# Patient Record
Sex: Female | Born: 1985 | Race: White | Hispanic: No | Marital: Single | Smoking: Current every day smoker
Health system: Southern US, Community
[De-identification: ages and names within clinical notes are randomized; demographics above are authoritative.]

## PROBLEM LIST (undated history)

## (undated) HISTORY — PX: TYMPANOPLASTY WITH GRAFT: SHX6567

---
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
## 2007-10-10 ENCOUNTER — Encounter: Admission: RE | Admit: 2007-10-10 | Discharge: 2007-10-10 | Payer: Self-pay | Admitting: Orthopedic Surgery

## 2007-11-08 ENCOUNTER — Emergency Department (HOSPITAL_COMMUNITY): Admission: EM | Admit: 2007-11-08 | Discharge: 2007-11-08 | Payer: Self-pay | Admitting: Oncology
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
## 2008-04-03 ENCOUNTER — Encounter: Admission: RE | Admit: 2008-04-03 | Discharge: 2008-04-03 | Payer: Self-pay | Admitting: Family Medicine

## 2012-07-02 ENCOUNTER — Encounter (HOSPITAL_COMMUNITY): Payer: Self-pay | Admitting: Emergency Medicine

## 2012-07-02 ENCOUNTER — Emergency Department (HOSPITAL_COMMUNITY)
Admission: EM | Admit: 2012-07-02 | Discharge: 2012-07-03 | Disposition: A | Discharge status: Home / Self Care | Payer: 59 | Attending: Emergency Medicine | Admitting: Emergency Medicine

## 2012-07-02 DIAGNOSIS — L0291 Cutaneous abscess, unspecified: Secondary | ICD-10-CM

## 2012-07-02 DIAGNOSIS — F17200 Nicotine dependence, unspecified, uncomplicated: Secondary | ICD-10-CM | POA: Insufficient documentation

## 2012-07-02 DIAGNOSIS — Z3202 Encounter for pregnancy test, result negative: Secondary | ICD-10-CM | POA: Insufficient documentation

## 2012-07-02 DIAGNOSIS — Y929 Unspecified place or not applicable: Secondary | ICD-10-CM | POA: Insufficient documentation

## 2012-07-02 DIAGNOSIS — L03319 Cellulitis of trunk, unspecified: Secondary | ICD-10-CM | POA: Insufficient documentation

## 2012-07-02 DIAGNOSIS — L02219 Cutaneous abscess of trunk, unspecified: Secondary | ICD-10-CM | POA: Insufficient documentation

## 2012-07-02 DIAGNOSIS — Y939 Activity, unspecified: Secondary | ICD-10-CM | POA: Insufficient documentation

## 2012-07-02 DIAGNOSIS — L089 Local infection of the skin and subcutaneous tissue, unspecified: Secondary | ICD-10-CM | POA: Insufficient documentation

## 2012-07-02 NOTE — ED Notes (Signed)
PT. REPORTS ? INSECT BITE AT LEFT LATERAL BACK ONSET YESTERDAY WITH REDDNESS/ NO DRAINAGE.

## 2012-07-03 LAB — PREGNANCY, URINE: Preg Test, Ur: NEGATIVE

## 2012-07-03 MED ORDER — SULFAMETHOXAZOLE-TMP DS 800-160 MG PO TABS
1.0000 | ORAL_TABLET | Freq: Once | ORAL | Status: AC
  Administered 2012-07-03: 1 via ORAL
  Filled 2012-07-03: qty 1

## 2012-07-03 MED ORDER — SULFAMETHOXAZOLE-TRIMETHOPRIM 800-160 MG PO TABS: 1.0000 | ORAL_TABLET | Freq: Two times a day (BID) | ORAL | Status: DC

## 2012-07-03 NOTE — ED Provider Notes (Signed)
History   This chart was scribed for Meghan Hutching, MD by Sofie Rower, ED Scribe. The patient was seen in room TR07C/TR07C and the patient's care was started at 12:02PM.     CSN: [id number]  Arrival date & time 07/02/12  2342   First MD Initiated Contact with Patient 07/03/12 0002      Chief Complaint  Patient presents with  . Insect Bite    (Consider location/radiation/quality/duration/timing/severity/associated sxs/prior treatment) The history is provided by the patient. No language interpreter was used.   JESIAH YERBY is a 26 y.o. female , who presents to the Emergency Department complaining of   sudden, progressively worsening, insect bite, located at the left lateral inferior chest wall, onset yesterday .  Associated symptoms include erythema located at the left lateral inferior chest wall. The pt reports she is concerned that she may have been bit by a bug yesterday, in addition to another possible pregnancy concern.   The pt is a current everyday smoker, however, she does not drink alcohol.    History reviewed. No pertinent past medical history.  History reviewed. No pertinent past surgical history.  No family history on file.  History  Substance Use Topics  . Smoking status: Current Every Day Smoker  . Smokeless tobacco: Not on file  . Alcohol Use: No    OB History    Grav Para Term Preterm Abortions TAB SAB Ect Mult Living                  Review of Systems  Skin: Positive for rash.  All other systems reviewed and are negative.    Allergies  Amoxil  Home Medications  No current outpatient prescriptions on file.  BP 153/94  Pulse 88  Temp 98.7 F (37.1 C) (Oral)  Resp 14  SpO2 99%  LMP 05/27/2012  Physical Exam  Nursing note and vitals reviewed. Constitutional: She is oriented to person, place, and time. She appears well-developed and well-nourished.  HENT:  Head: Atraumatic.  Nose: Nose normal.  Pulmonary/Chest:       Left lateral inferior  chest wall: Area of erythema 4 X 2 CM with central indurated core of 1 CM.   Musculoskeletal: Normal range of motion.  Neurological: She is alert and oriented to person, place, and time.  Skin: Skin is warm and dry.  Psychiatric: She has a normal mood and affect.    ED Course  Procedures (including critical care time)  DIAGNOSTIC STUDIES: Oxygen Saturation is 99% on room air, normal by my interpretation.    COORDINATION OF CARE:  12:08 AM- Treatment plan discussed with patient. Pt agrees with treatment.      Labs Reviewed - No data to display No results found.   No diagnosis found.    MDM  Antibiotic, moist heat;  Not ready for I and D      I personally performed the services described in this documentation, which was scribed in my presence. The recorded information has been reviewed and is accurate.    Meghan Hutching, MD 07/03/12 [phone]

## 2012-07-04 NOTE — L&D Delivery Note (Signed)
Delivery Note Was notified pt completely dilated by RN and they were going to start pushing.  Approximately 5-10 minutes later was called to delivery and came immediately.  Arrived to find baby delivered and on mother's chest.  Faculty practice in room but apparently did not have time to glove and RN delivered baby.  RN states they had not even begun pushing, and pt called out saying baby coming. At 10:50 AM a healthy female was delivered OA with involuntary pushing. Placenta status:  I delivered on arrival spontaneouos and intact:.  Cord:  with the following complications:none .  Anesthesia:  epidural Episiotomy: none Lacerations: small vaginal abrasions, one sutured at 600 for hemostasis Suture Repair: 3.0 vicryl rapide Est. Blood Loss (mL): 350cc  Mom to postpartum.  Baby to stay with mother  Meghan Garrett 04/17/2013, 11:09 AM

## 2012-12-04 ENCOUNTER — Emergency Department (INDEPENDENT_AMBULATORY_CARE_PROVIDER_SITE_OTHER)
Admission: EM | Admit: 2012-12-04 | Discharge: 2012-12-04 | Disposition: A | Discharge status: Home / Self Care | Payer: Worker's Compensation | Source: Home / Self Care

## 2012-12-04 ENCOUNTER — Encounter (HOSPITAL_COMMUNITY): Payer: Self-pay | Admitting: Emergency Medicine

## 2012-12-04 DIAGNOSIS — W010XXA Fall on same level from slipping, tripping and stumbling without subsequent striking against object, initial encounter: Secondary | ICD-10-CM

## 2012-12-04 DIAGNOSIS — R109 Unspecified abdominal pain: Secondary | ICD-10-CM

## 2012-12-04 LAB — POCT URINALYSIS DIP (DEVICE)
Bilirubin Urine: NEGATIVE
Ketones, ur: NEGATIVE mg/dL
Leukocytes, UA: NEGATIVE
Specific Gravity, Urine: >=1.03 (ref 1.005–1.030)
pH: 5.5 (ref 5.0–8.0)

## 2012-12-04 NOTE — ED Notes (Signed)
Pt c/o cramping. Pt is [redacted] wks pregnant. Pt states that she tripped over wires at work and is unsure of how she landed. Pt has only had one liter of water to drink today. Appetite is good. Denies any other symptoms.  Fetal heart rate was 154. Mw,cma

## 2012-12-04 NOTE — ED Provider Notes (Signed)
History     CSN: [id number]  Arrival date & time 12/04/12  1719   None     Chief Complaint  Patient presents with  . Fall    tripped over wires at work. "cant remember how i landed"    (Consider location/radiation/quality/duration/timing/severity/associated sxs/prior treatment) Patient is a 27 y.o. female presenting with fall. The history is provided by the patient and a parent.  Fall This is a new problem. The current episode started 3 to 5 hours ago. The problem has been gradually improving. Associated symptoms include abdominal pain. Associated symptoms comments: 21 wk preg, tripped and fell on abd, having cramping without bleeding.Marland Kitchen    History reviewed. No pertinent past medical history.  History reviewed. No pertinent past surgical history.  History reviewed. No pertinent family history.  History  Substance Use Topics  . Smoking status: Current Every Day Smoker  . Smokeless tobacco: Not on file  . Alcohol Use: No    OB History   Grav Para Term Preterm Abortions TAB SAB Ect Mult Living   1               Review of Systems  Constitutional: Negative.   Gastrointestinal: Positive for abdominal pain. Negative for nausea, vomiting and diarrhea.  Genitourinary: Negative for vaginal bleeding and pelvic pain.    Allergies  Amoxil  Home Medications   Current Outpatient Rx  Name  Route  Sig  Dispense  Refill  . sulfamethoxazole-trimethoprim (SEPTRA DS) 800-160 MG per tablet   Oral   Take 1 tablet by mouth 2 (two) times daily.   20 tablet   0     BP 124/74  Pulse 89  Temp(Src) 98.6 F (37 C) (Oral)  Resp 18  SpO2 100%  LMP 07/06/2012  Physical Exam  Nursing note and vitals reviewed. Constitutional: She is oriented to person, place, and time. She appears well-developed and well-nourished.  HENT:  Head: Normocephalic and atraumatic.  Neck: Normal range of motion. Neck supple.  Abdominal: Soft. Bowel sounds are normal. She exhibits mass. There is no  rebound and no guarding.  Uterus palp ,nontender, no visible trauma.fht active.  Neurological: She is alert and oriented to person, place, and time.  Skin: Skin is warm and dry.    ED Course  Procedures (including critical care time)  Labs Reviewed  POCT URINALYSIS DIP (DEVICE)   No results found.   1. Fall due to stumbling, initial encounter       MDM  fht heardin llq 140. U/a neg.        Linna Hoff, MD 12/04/12 [phone]

## 2012-12-04 NOTE — ED Notes (Signed)
Pt states that she has a regular routine prenatal visit tomorrow. Mw,cma

## 2013-01-17 LAB — OB RESULTS CONSOLE ABO/RH

## 2013-01-17 LAB — OB RESULTS CONSOLE HEPATITIS B SURFACE ANTIGEN: Hepatitis B Surface Ag: NEGATIVE

## 2013-01-17 LAB — OB RESULTS CONSOLE RUBELLA ANTIBODY, IGM: Rubella: IMMUNE

## 2013-01-17 LAB — OB RESULTS CONSOLE RPR: RPR: NONREACTIVE

## 2013-01-17 LAB — OB RESULTS CONSOLE ANTIBODY SCREEN: Antibody Screen: NEGATIVE

## 2013-03-13 LAB — OB RESULTS CONSOLE GBS: GBS: POSITIVE

## 2013-04-15 ENCOUNTER — Telehealth (HOSPITAL_COMMUNITY): Payer: Self-pay | Admitting: *Deleted

## 2013-04-15 NOTE — Telephone Encounter (Signed)
Preadmission screen  

## 2013-04-17 ENCOUNTER — Inpatient Hospital Stay (HOSPITAL_COMMUNITY): Payer: 59 | Admitting: Anesthesiology

## 2013-04-17 ENCOUNTER — Inpatient Hospital Stay (HOSPITAL_COMMUNITY)
Admission: AD | Admit: 2013-04-17 | Discharge: 2013-04-19 | DRG: 775 | Disposition: A | Discharge status: Home / Self Care | Payer: 59 | Source: Ambulatory Visit | Attending: Obstetrics and Gynecology | Admitting: Obstetrics and Gynecology

## 2013-04-17 ENCOUNTER — Encounter (HOSPITAL_COMMUNITY): Payer: Self-pay | Admitting: *Deleted

## 2013-04-17 ENCOUNTER — Encounter (HOSPITAL_COMMUNITY): Payer: 59 | Admitting: Anesthesiology

## 2013-04-17 DIAGNOSIS — Z880 Allergy status to penicillin: Secondary | ICD-10-CM

## 2013-04-17 DIAGNOSIS — O99892 Other specified diseases and conditions complicating childbirth: Secondary | ICD-10-CM | POA: Diagnosis present

## 2013-04-17 DIAGNOSIS — Z3400 Encounter for supervision of normal first pregnancy, unspecified trimester: Secondary | ICD-10-CM

## 2013-04-17 DIAGNOSIS — Z3403 Encounter for supervision of normal first pregnancy, third trimester: Secondary | ICD-10-CM

## 2013-04-17 DIAGNOSIS — O99334 Smoking (tobacco) complicating childbirth: Secondary | ICD-10-CM | POA: Diagnosis present

## 2013-04-17 DIAGNOSIS — Z22330 Carrier of Group B streptococcus: Secondary | ICD-10-CM

## 2013-04-17 LAB — CBC
HCT: 37.7 % (ref 36.0–46.0)
Hemoglobin: 12.9 g/dL (ref 12.0–15.0)
MCHC: 34.2 g/dL (ref 30.0–36.0)
WBC: 15.8 10*3/uL — ABNORMAL HIGH (ref 4.0–10.5)

## 2013-04-17 MED ORDER — OXYTOCIN 40 UNITS IN LACTATED RINGERS INFUSION - SIMPLE MED: 1.0000 m[IU]/min | INTRAVENOUS | Status: DC

## 2013-04-17 MED ORDER — FENTANYL CITRATE 0.05 MG/ML IJ SOLN
INTRAMUSCULAR | Status: AC
  Filled 2013-04-17: qty 2

## 2013-04-17 MED ORDER — ACETAMINOPHEN 325 MG PO TABS: 650.0000 mg | ORAL_TABLET | ORAL | Status: DC | PRN

## 2013-04-17 MED ORDER — FENTANYL 2.5 MCG/ML BUPIVACAINE 1/10 % EPIDURAL INFUSION (WH - ANES)
14.0000 mL/h | INTRAMUSCULAR | Status: DC | PRN
  Filled 2013-04-17 (×2): qty 125

## 2013-04-17 MED ORDER — BUTORPHANOL TARTRATE 1 MG/ML IJ SOLN
1.0000 mg | INTRAMUSCULAR | Status: DC | PRN
  Administered 2013-04-17: 1 mg via INTRAVENOUS
  Filled 2013-04-17: qty 1

## 2013-04-17 MED ORDER — BENZOCAINE-MENTHOL 20-0.5 % EX AERO
1.0000 "application " | INHALATION_SPRAY | CUTANEOUS | Status: DC | PRN
  Administered 2013-04-17: 1 via TOPICAL
  Filled 2013-04-17: qty 56

## 2013-04-17 MED ORDER — ONDANSETRON HCL 4 MG/2ML IJ SOLN: 4.0000 mg | Freq: Four times a day (QID) | INTRAMUSCULAR | Status: DC | PRN

## 2013-04-17 MED ORDER — FENTANYL 2.5 MCG/ML BUPIVACAINE 1/10 % EPIDURAL INFUSION (WH - ANES)
INTRAMUSCULAR | Status: DC | PRN
  Administered 2013-04-17: 14 mL/h via EPIDURAL

## 2013-04-17 MED ORDER — LACTATED RINGERS IV SOLN: INTRAVENOUS | Status: DC

## 2013-04-17 MED ORDER — DIBUCAINE 1 % RE OINT: 1.0000 "application " | TOPICAL_OINTMENT | RECTAL | Status: DC | PRN

## 2013-04-17 MED ORDER — ONDANSETRON HCL 4 MG PO TABS: 4.0000 mg | ORAL_TABLET | ORAL | Status: DC | PRN

## 2013-04-17 MED ORDER — BUPIVACAINE HCL (PF) 0.25 % IJ SOLN
INTRAMUSCULAR | Status: DC | PRN
  Administered 2013-04-17 (×2): 5 mL via EPIDURAL

## 2013-04-17 MED ORDER — PHENYLEPHRINE 40 MCG/ML (10ML) SYRINGE FOR IV PUSH (FOR BLOOD PRESSURE SUPPORT)
80.0000 ug | PREFILLED_SYRINGE | INTRAVENOUS | Status: DC | PRN
  Filled 2013-04-17: qty 2

## 2013-04-17 MED ORDER — OXYCODONE-ACETAMINOPHEN 5-325 MG PO TABS: 1.0000 | ORAL_TABLET | ORAL | Status: DC | PRN

## 2013-04-17 MED ORDER — TETANUS-DIPHTH-ACELL PERTUSSIS 5-2.5-18.5 LF-MCG/0.5 IM SUSP: 0.5000 mL | Freq: Once | INTRAMUSCULAR | Status: DC

## 2013-04-17 MED ORDER — DIPHENHYDRAMINE HCL 25 MG PO CAPS: 25.0000 mg | ORAL_CAPSULE | Freq: Four times a day (QID) | ORAL | Status: DC | PRN

## 2013-04-17 MED ORDER — LANOLIN HYDROUS EX OINT: TOPICAL_OINTMENT | CUTANEOUS | Status: DC | PRN

## 2013-04-17 MED ORDER — VANCOMYCIN HCL IN DEXTROSE 1-5 GM/200ML-% IV SOLN
1000.0000 mg | Freq: Two times a day (BID) | INTRAVENOUS | Status: DC
  Filled 2013-04-17: qty 200

## 2013-04-17 MED ORDER — LACTATED RINGERS IV SOLN
500.0000 mL | INTRAVENOUS | Status: DC | PRN
  Administered 2013-04-17: 500 mL via INTRAVENOUS

## 2013-04-17 MED ORDER — SENNOSIDES-DOCUSATE SODIUM 8.6-50 MG PO TABS
2.0000 | ORAL_TABLET | ORAL | Status: DC
  Administered 2013-04-18: 2 via ORAL
  Filled 2013-04-17: qty 2

## 2013-04-17 MED ORDER — OXYTOCIN BOLUS FROM INFUSION: 500.0000 mL | INTRAVENOUS | Status: DC

## 2013-04-17 MED ORDER — WITCH HAZEL-GLYCERIN EX PADS: 1.0000 "application " | MEDICATED_PAD | CUTANEOUS | Status: DC | PRN

## 2013-04-17 MED ORDER — ONDANSETRON HCL 4 MG/2ML IJ SOLN: 4.0000 mg | INTRAMUSCULAR | Status: DC | PRN

## 2013-04-17 MED ORDER — PHENYLEPHRINE 40 MCG/ML (10ML) SYRINGE FOR IV PUSH (FOR BLOOD PRESSURE SUPPORT)
80.0000 ug | PREFILLED_SYRINGE | INTRAVENOUS | Status: DC | PRN
  Administered 2013-04-17: 10:00:00 via INTRAVENOUS
  Filled 2013-04-17: qty 2
  Filled 2013-04-17 (×2): qty 5

## 2013-04-17 MED ORDER — LIDOCAINE HCL (PF) 1 % IJ SOLN
INTRAMUSCULAR | Status: DC | PRN
  Administered 2013-04-17: 9 mL
  Administered 2013-04-17 (×4): 4 mL
  Administered 2013-04-17: 9 mL

## 2013-04-17 MED ORDER — EPHEDRINE 5 MG/ML INJ
10.0000 mg | INTRAVENOUS | Status: DC | PRN
  Filled 2013-04-17 (×3): qty 4
  Filled 2013-04-17: qty 2

## 2013-04-17 MED ORDER — OXYTOCIN 40 UNITS IN LACTATED RINGERS INFUSION - SIMPLE MED: 62.5000 mL/h | INTRAVENOUS | Status: DC

## 2013-04-17 MED ORDER — PRENATAL MULTIVITAMIN CH
1.0000 | ORAL_TABLET | Freq: Every day | ORAL | Status: DC
  Administered 2013-04-18: 1 via ORAL
  Filled 2013-04-17: qty 1

## 2013-04-17 MED ORDER — TERBUTALINE SULFATE 1 MG/ML IJ SOLN: 0.2500 mg | Freq: Once | INTRAMUSCULAR | Status: DC | PRN

## 2013-04-17 MED ORDER — ZOLPIDEM TARTRATE 5 MG PO TABS: 5.0000 mg | ORAL_TABLET | Freq: Every evening | ORAL | Status: DC | PRN

## 2013-04-17 MED ORDER — INFLUENZA VAC SPLIT QUAD 0.5 ML IM SUSP
0.5000 mL | INTRAMUSCULAR | Status: AC
  Administered 2013-04-18: 0.5 mL via INTRAMUSCULAR

## 2013-04-17 MED ORDER — LIDOCAINE HCL (PF) 1 % IJ SOLN
30.0000 mL | INTRAMUSCULAR | Status: DC | PRN
  Filled 2013-04-17 (×2): qty 30

## 2013-04-17 MED ORDER — IBUPROFEN 600 MG PO TABS: 600.0000 mg | ORAL_TABLET | Freq: Four times a day (QID) | ORAL | Status: DC | PRN

## 2013-04-17 MED ORDER — FENTANYL CITRATE 0.05 MG/ML IJ SOLN
100.0000 ug | Freq: Once | INTRAMUSCULAR | Status: AC
  Administered 2013-04-17: 100 ug via EPIDURAL

## 2013-04-17 MED ORDER — IBUPROFEN 600 MG PO TABS
600.0000 mg | ORAL_TABLET | Freq: Four times a day (QID) | ORAL | Status: DC
  Administered 2013-04-17 – 2013-04-19 (×6): 600 mg via ORAL
  Filled 2013-04-17 (×8): qty 1

## 2013-04-17 MED ORDER — LACTATED RINGERS IV SOLN
500.0000 mL | Freq: Once | INTRAVENOUS | Status: AC
  Administered 2013-04-17: 500 mL via INTRAVENOUS

## 2013-04-17 MED ORDER — FLEET ENEMA 7-19 GM/118ML RE ENEM: 1.0000 | ENEMA | RECTAL | Status: DC | PRN

## 2013-04-17 MED ORDER — EPHEDRINE 5 MG/ML INJ
10.0000 mg | INTRAVENOUS | Status: DC | PRN
  Filled 2013-04-17: qty 2

## 2013-04-17 MED ORDER — DIPHENHYDRAMINE HCL 50 MG/ML IJ SOLN: 12.5000 mg | INTRAMUSCULAR | Status: DC | PRN

## 2013-04-17 MED ORDER — SIMETHICONE 80 MG PO CHEW: 80.0000 mg | CHEWABLE_TABLET | ORAL | Status: DC | PRN

## 2013-04-17 MED ORDER — CITRIC ACID-SODIUM CITRATE 334-500 MG/5ML PO SOLN: 30.0000 mL | ORAL | Status: DC | PRN

## 2013-04-17 MED ORDER — OXYTOCIN 40 UNITS IN LACTATED RINGERS INFUSION - SIMPLE MED
INTRAVENOUS | Status: AC
  Filled 2013-04-17: qty 1000

## 2013-04-17 NOTE — MAU Note (Signed)
PT SAYS SHE START HURT BAD AT 0100.     LAST WEEK - CERVIX- THIN.   DENIES HSV AND MRSA.

## 2013-04-17 NOTE — Anesthesia Procedure Notes (Addendum)
Epidural Patient location during procedure: OB Start time: 04/17/2013 7:49 AM End time: 04/17/2013 7:54 AM  Staffing Anesthesiologist: Sandrea Hughs Performed by: anesthesiologist   Preanesthetic Checklist Completed: patient identified, surgical consent, pre-op evaluation, timeout performed, IV checked, risks and benefits discussed and monitors and equipment checked  Epidural Patient position: sitting Prep: site prepped and draped and DuraPrep Patient monitoring: continuous pulse ox and blood pressure Approach: midline Injection technique: LOR air  Needle:  Needle type: Tuohy  Needle gauge: 17 G Needle length: 9 cm and 9 Needle insertion depth: 9 cm Catheter type: closed end flexible Catheter size: 19 Gauge Catheter at skin depth: 15 cm Test dose: negative and Other  Assessment Sensory level: T9 Events: blood not aspirated, injection not painful, no injection resistance, negative IV test and no paresthesia  Additional Notes Reason for block:procedure for pain  Epidural Patient location during procedure: OB Start time: 04/17/2013 9:50 AM  Staffing Performed by: anesthesiologist   Preanesthetic Checklist Completed: patient identified, site marked, surgical consent, pre-op evaluation, timeout performed, IV checked, risks and benefits discussed and monitors and equipment checked  Epidural Patient position: sitting Prep: site prepped and draped and DuraPrep Patient monitoring: continuous pulse ox and blood pressure Approach: midline Injection technique: LOR air  Needle:  Needle type: Tuohy  Needle gauge: 17 G Needle length: 9 cm and 9 Needle insertion depth: 8 cm Catheter type: closed end flexible Catheter size: 19 Gauge Catheter at skin depth: 13 cm Test dose: negative  Assessment Events: blood not aspirated, injection not painful, no injection resistance, negative IV test and no paresthesia  Additional Notes Discussed risk of headache,  infection, bleeding, nerve injury and failed or incomplete block.  Patient voices understanding and wishes to proceed.  Epidural placed easily on first attempt.  NO paresthesia.  Patient tolerated procedure well with no apparent complications.  Jasmine December, MDReason for block:procedure for pain

## 2013-04-17 NOTE — Progress Notes (Signed)
   Subjective: Pt got about 30 minutes of relief from epidural, now very uncomfortable again  Objective: BP 153/89  Pulse 70  Temp(Src) 98.1 F (36.7 C) (Oral)  Resp 18  Ht 5\' 4"  (1.626 m)  Wt 135.796 kg (299 lb 6 oz)  BMI 51.36 kg/m2  SpO2 96%  LMP 07/06/2012      FHT:  FHR: 130 bpm, variability: moderate,  accelerations:  Present,  decelerations:  Absent UC:   regular, every 2-3 minutes SVE:  C/5-6/-1  AROM thick meconium Labs: Lab Results  Component Value Date   WBC 15.8* 04/17/2013   HGB 12.9 04/17/2013   HCT 37.7 04/17/2013   MCV 82.7 04/17/2013   PLT 334 04/17/2013    Assessment / Plan: Spontaneous labor, progressing normally Will get anesthesia to assess epidural  Meghan Garrett 04/17/2013, 9:20 AM

## 2013-04-17 NOTE — Progress Notes (Signed)
Dr. Rodman Pickle called to bedside for increasing pain per patient--epidural redose was ineffective--new epidural placed

## 2013-04-17 NOTE — H&P (Signed)
Meghan Garrett is a 27 y.o. female G1P0 with contractions, 40+ Has induction set up for iol on Thursday, pt very uncomfortable, requests admission.  +FM, no LOF, no VB, ctx - increasing in intensity and frequency.  Pregnancy complicated by GBBS+ PCN allergic, given vancomycin.  Pt 1/2 ppd smoker.   . Maternal Medical History:  Reason for admission: Contractions.   Contractions: Onset was 3-5 hours ago.   Frequency: regular.   Perceived severity is strong.    Fetal activity: Perceived fetal activity is normal.   Last perceived fetal movement was within the past hour.    Prenatal Complications - Diabetes: none.    OB History   Grav Para Term Preterm Abortions TAB SAB Ect Mult Living   1             G1 present No abn pap No STDs  History reviewed. No pertinent past medical history. PSH: ear drum sx as child Family History: family history is not on file. Social History:  reports that she has been smoking.  She does not have any smokeless tobacco history on file. She reports that she does not drink alcohol or use illicit drugs. 1/2 ppd Meds  PNV All amoxicillin   Prenatal Transfer Tool  Maternal Diabetes: No Genetic Screening: Normal Maternal Ultrasounds/Referrals: Normal Fetal Ultrasounds or other Referrals:  None Maternal Substance Abuse:  Yes:  Type: Smoker Significant Maternal Medications:  None Significant Maternal Lab Results:  Lab values include: Group B Strep positive Other Comments:  None  Review of Systems  Constitutional: Negative.   HENT: Negative.   Eyes: Negative.   Respiratory: Negative.   Cardiovascular: Negative.   Gastrointestinal: Negative.   Genitourinary: Negative.   Musculoskeletal: Negative.   Skin: Negative.   Neurological: Negative.   Psychiatric/Behavioral: Negative.     Dilation: 1 Effacement (%): 70 Station: -1 Exam by:: DCALLAWAY, RN Blood pressure 153/89, pulse 70, temperature 98.1 F (36.7 C), temperature source Oral, resp. rate  18, height 5\' 4"  (1.626 m), weight 135.796 kg (299 lb 6 oz), last menstrual period 07/06/2012, SpO2 96.00%. Maternal Exam:  Uterine Assessment: Contraction strength is moderate.  Contraction frequency is regular.   Abdomen: Fundal height is appropriate for gestation.   Estimated fetal weight is 7-8#.   Fetal presentation: vertex  Introitus: Normal vulva. Normal vagina.  Pelvis: adequate for delivery.   Cervix: Cervix evaluated by digital exam.     Physical Exam  Constitutional: She is oriented to person, place, and time. She appears well-developed and well-nourished.  HENT:  Head: Normocephalic and atraumatic.  Cardiovascular: Normal rate and regular rhythm.   Respiratory: Effort normal and breath sounds normal. No respiratory distress. She has no wheezes.  GI: Soft. Bowel sounds are normal. There is no tenderness.  Musculoskeletal: Normal range of motion.  Neurological: She is alert and oriented to person, place, and time.  Skin: Skin is warm and dry.  Psychiatric: She has a normal mood and affect. Her behavior is normal.    Prenatal labs: ABO, Rh: A/Positive/-- (07/17 0000) Antibody:  negative Rubella:  immune RPR: Nonreactive (07/17 0000)  HBsAg:   neg HIV: Non-reactive (07/17 0000)  GBS: Positive (09/10 0000)   First Tri Screen - WNL/ AFP WNL/ Ur Cx neg/ GC neg/ Chl neg/ glucola 104/ CF negative [place] nl anat T dap 01/17/13 Assessment/Plan: 27yo G1P0 admit in early labor Epidural for pain Vancomycin for GBBS prophylaxis Expect SVD AROM after Vanc Pitocin to augment   BOVARD,Benay Pomeroy 04/17/2013, 8:46 AM

## 2013-04-17 NOTE — Anesthesia Preprocedure Evaluation (Signed)
Anesthesia Evaluation  Patient identified by MRN, date of birth, ID band Patient awake    Reviewed: Allergy & Precautions, H&P , NPO status , Patient's Chart, lab work & pertinent test results  Airway Mallampati: III TM Distance: >3 FB Neck ROM: full    Dental no notable dental hx.    Pulmonary Current Smoker,    Pulmonary exam normal       Cardiovascular negative cardio ROS      Neuro/Psych negative neurological ROS  negative psych ROS   GI/Hepatic negative GI ROS, Neg liver ROS,   Endo/Other  Morbid obesity  Renal/GU negative Renal ROS     Musculoskeletal   Abdominal (+) + obese,   Peds  Hematology negative hematology ROS (+)   Anesthesia Other Findings   Reproductive/Obstetrics (+) Pregnancy                           Anesthesia Physical Anesthesia Plan  ASA: III  Anesthesia Plan: Epidural   Post-op Pain Management:    Induction:   Airway Management Planned:   Additional Equipment:   Intra-op Plan:   Post-operative Plan:   Informed Consent: I have reviewed the patients History and Physical, chart, labs and discussed the procedure including the risks, benefits and alternatives for the proposed anesthesia with the patient or authorized representative who has indicated his/her understanding and acceptance.     Plan Discussed with:   Anesthesia Plan Comments:         Anesthesia Quick Evaluation

## 2013-04-17 NOTE — Anesthesia Postprocedure Evaluation (Signed)
  Anesthesia Post-op Note  Patient: Meghan Garrett  Procedure(s) Performed: * No procedures listed *  Patient Location: PACU and Mother/Baby  Anesthesia Type:Epidural  Level of Consciousness: awake, alert , oriented and patient cooperative  Airway and Oxygen Therapy: Patient Spontanous Breathing  Post-op Pain: none  Post-op Assessment: Post-op Vital signs reviewed, Patient's Cardiovascular Status Stable and Respiratory Function Stable  Post-op Vital Signs: Reviewed and stable  Complications: No apparent anesthesia complications

## 2013-04-18 ENCOUNTER — Inpatient Hospital Stay (HOSPITAL_COMMUNITY): Admission: RE | Admit: 2013-04-18 | Payer: 59 | Source: Ambulatory Visit

## 2013-04-18 LAB — CBC
HCT: 30 % — ABNORMAL LOW (ref 36.0–46.0)
Hemoglobin: 10.1 g/dL — ABNORMAL LOW (ref 12.0–15.0)
MCHC: 33.7 g/dL (ref 30.0–36.0)
MCV: 83.6 fL (ref 78.0–100.0)
RBC: 3.59 MIL/uL — ABNORMAL LOW (ref 3.87–5.11)

## 2013-04-18 NOTE — Lactation Note (Signed)
This note was copied from the chart of Meghan Garrett. Lactation Consultation Note    Initial consult with this mom and baby, now 73 hours old. I assisted mom with latching in football hold. Mom had her latched independently with a deep latch and strong suckles.  I showed mom how to hand express, but was not able to express any colostrum.  I reviewed the breast feeding pages in the Baby and Me book with mom, as wellas the lactation services. Mom knows to call for questions/concerns.  Patient Name: Meghan Garrett Date: 04/18/2013 Reason for consult: Initial assessment   Maternal Data Formula Feeding for Exclusion: No Infant to breast within first hour of birth: Yes Has patient been taught Hand Expression?: Yes Does the patient have breastfeeding experience prior to this delivery?: No  Feeding Feeding Type: Breast Fed Length of feed: 15 min  LATCH Score/Interventions Latch: Grasps breast easily, tongue down, lips flanged, rhythmical sucking. Intervention(s): Adjust position;Assist with latch;Breast massage;Breast compression  Audible Swallowing: None Intervention(s): Skin to skin;Hand expression (not able to expresscolostrum) Intervention(s): Skin to skin;Hand expression  Type of Nipple: Everted at rest and after stimulation  Comfort (Breast/Nipple): Soft / non-tender     Hold (Positioning): Assistance needed to correctly position infant at breast and maintain latch. Intervention(s): Breastfeeding basics reviewed;Support Pillows;Position options;Skin to skin (football hold used)  LATCH Score: 7  Lactation Tools Discussed/Used     Consult Status Consult Status: Follow-up Date: 04/19/13 Follow-up type: In-patient    Alfred Levins 04/18/2013, 6:34 PM

## 2013-04-18 NOTE — Progress Notes (Signed)
Post Partum Day 1 Subjective: no complaints and up ad lib  Objective: Blood pressure 133/80, pulse 58, temperature 98.2 F (36.8 C), temperature source Oral, resp. rate 16, height 5\' 4"  (1.626 m), weight 135.796 kg (299 lb 6 oz), last menstrual period 07/06/2012, SpO2 95.00%, unknown if currently breastfeeding.  Physical Exam:  General: alert and cooperative Lochia: appropriate Uterine Fundus: firm    Recent Labs  04/17/13 0655  HGB 12.9  HCT 37.7    Assessment/Plan: Plan for discharge tomorrow   LOS: 1 day   Meghan Garrett 04/18/2013, 8:26 AM

## 2013-04-18 NOTE — Progress Notes (Signed)
CSW received consult to "help with Medicaid."  CSW does not assist with Medicaid applications and has contacted the hospital financial counselor to pass along the consult.  CSW screening out referral at this time.

## 2013-04-19 MED ORDER — IBUPROFEN 600 MG PO TABS: 600.0000 mg | ORAL_TABLET | Freq: Four times a day (QID) | ORAL | Status: DC

## 2013-04-19 NOTE — Discharge Summary (Signed)
Obstetric Discharge Summary Reason for Admission: onset of labor Prenatal Procedures: none Intrapartum Procedures: spontaneous vaginal delivery Postpartum Procedures: none Complications-Operative and Postpartum: small first  degree perineal laceration Hemoglobin  Date Value Range Status  04/18/2013 10.1* 12.0 - 15.0 g/dL Final     DELTA CHECK NOTED     REPEATED TO VERIFY     HCT  Date Value Range Status  04/18/2013 30.0* 36.0 - 46.0 % Final    Physical Exam:  General: alert and cooperative Lochia: appropriate Uterine Fundus: firm   Discharge Diagnoses: Term Pregnancy-delivered  Discharge Information: Date: 04/19/2013 Activity: pelvic rest Diet: routine Medications: Ibuprofen Condition: improved Instructions: refer to practice specific booklet Discharge to: home Follow-up Information   Follow up with Oliver Pila, MD. Schedule an appointment as soon as possible for a visit in 6 weeks. (postpartum)    Specialty:  Obstetrics and Gynecology   Contact information:   [address], [address] [place] [address] [phone]       Newborn Data: Live born female  Birth Weight: 6 lb 15.8 oz (3170 g) APGAR: 9, 9  Home with mother.  Oliver Pila 04/19/2013, 9:19 AM

## 2013-04-19 NOTE — Progress Notes (Signed)
Post Partum Day 2 Subjective: no complaints, up ad lib and tolerating PO  Objective: Blood pressure 123/80, pulse 74, temperature 97.9 F (36.6 C), temperature source Oral, resp. rate 18, height 5\' 4"  (1.626 m), weight 135.796 kg (299 lb 6 oz), last menstrual period 07/06/2012, SpO2 95.00%, unknown if currently breastfeeding.  Physical Exam:  General: alert and cooperative Lochia: appropriate Uterine Fundus: firm     Recent Labs  04/17/13 0655 04/18/13 0820  HGB 12.9 10.1*  HCT 37.7 30.0*    Assessment/Plan: Discharge home   LOS: 2 days   Jaimen Melone W 04/19/2013, 9:18 AM

## 2013-05-02 ENCOUNTER — Encounter (HOSPITAL_COMMUNITY): Payer: Self-pay | Admitting: *Deleted

## 2014-01-07 ENCOUNTER — Emergency Department (HOSPITAL_COMMUNITY): Payer: Self-pay

## 2014-01-07 ENCOUNTER — Emergency Department (HOSPITAL_COMMUNITY)
Admission: EM | Admit: 2014-01-07 | Discharge: 2014-01-07 | Disposition: A | Discharge status: Home / Self Care | Payer: Self-pay | Attending: Emergency Medicine | Admitting: Emergency Medicine

## 2014-01-07 ENCOUNTER — Encounter (HOSPITAL_COMMUNITY): Payer: Self-pay | Admitting: Emergency Medicine

## 2014-01-07 DIAGNOSIS — X500XXA Overexertion from strenuous movement or load, initial encounter: Secondary | ICD-10-CM | POA: Insufficient documentation

## 2014-01-07 DIAGNOSIS — IMO0001 Reserved for inherently not codable concepts without codable children: Secondary | ICD-10-CM

## 2014-01-07 DIAGNOSIS — IMO0002 Reserved for concepts with insufficient information to code with codable children: Secondary | ICD-10-CM | POA: Insufficient documentation

## 2014-01-07 DIAGNOSIS — S66911A Strain of unspecified muscle, fascia and tendon at wrist and hand level, right hand, initial encounter: Secondary | ICD-10-CM

## 2014-01-07 DIAGNOSIS — E669 Obesity, unspecified: Secondary | ICD-10-CM | POA: Insufficient documentation

## 2014-01-07 DIAGNOSIS — S6390XA Sprain of unspecified part of unspecified wrist and hand, initial encounter: Secondary | ICD-10-CM | POA: Insufficient documentation

## 2014-01-07 DIAGNOSIS — Y929 Unspecified place or not applicable: Secondary | ICD-10-CM | POA: Insufficient documentation

## 2014-01-07 DIAGNOSIS — Z791 Long term (current) use of non-steroidal anti-inflammatories (NSAID): Secondary | ICD-10-CM | POA: Insufficient documentation

## 2014-01-07 DIAGNOSIS — F17200 Nicotine dependence, unspecified, uncomplicated: Secondary | ICD-10-CM | POA: Insufficient documentation

## 2014-01-07 DIAGNOSIS — Y9389 Activity, other specified: Secondary | ICD-10-CM | POA: Insufficient documentation

## 2014-01-07 MED ORDER — HYDROCODONE-ACETAMINOPHEN 5-325 MG PO TABS: 1.0000 | ORAL_TABLET | ORAL | Status: DC | PRN

## 2014-01-07 NOTE — ED Notes (Signed)
Ortho tech called for application wrist splint.

## 2014-01-07 NOTE — Discharge Instructions (Signed)
Your x-rays today do not show any broken bones or other concerning injury in your hand. Use rest, ice, compression and elevation to reduce pain and swelling in your hands. Take ibuprofen or Aleve for pain and inflammation. Followup with a primary care provider for continued evaluation and treatment.   Hand Contusion A hand contusion is a deep bruise on your hand area. Contusions are the result of an injury that caused bleeding under the skin. The contusion may turn blue, purple, or yellow. Minor injuries will give you a painless contusion, but more severe contusions may stay painful and swollen for a few weeks. CAUSES  A contusion is usually caused by a blow, trauma, or direct force to an area of the body. SYMPTOMS   Swelling and redness of the injured area.  Discoloration of the injured area.  Tenderness and soreness of the injured area.  Pain. DIAGNOSIS  The diagnosis can be made by taking a history and performing a physical exam. An X-ray, CT scan, or MRI may be needed to determine if there were any associated injuries, such as broken bones (fractures). TREATMENT  Often, the best treatment for a hand contusion is resting, elevating, icing, and applying cold compresses to the injured area. Over-the-counter medicines may also be recommended for pain control. HOME CARE INSTRUCTIONS   Put ice on the injured area.  Put ice in a plastic bag.  Place a towel between your skin and the bag.  Leave the ice on for 15-20 minutes, 03-04 times a day.  Only take over-the-counter or prescription medicines as directed by your caregiver. Your caregiver may recommend avoiding anti-inflammatory medicines (aspirin, ibuprofen, and naproxen) for 48 hours because these medicines may increase bruising.  If told, use an elastic wrap as directed. This can help reduce swelling. You may remove the wrap for sleeping, showering, and bathing. If your fingers become numb, cold, or blue, take the wrap off and  reapply it more loosely.  Elevate your hand with pillows to reduce swelling.  Avoid overusing your hand if it is painful. SEEK IMMEDIATE MEDICAL CARE IF:   You have increased redness, swelling, or pain in your hand.  Your swelling or pain is not relieved with medicines.  You have loss of feeling in your hand or are unable to move your fingers.  Your hand turns cold or blue.  You have pain when you move your fingers.  Your hand becomes warm to the touch.  Your contusion does not improve in 2 days. MAKE SURE YOU:   Understand these instructions.  Will watch your condition.  Will get help right away if you are not doing well or get worse. Document Released: 12/10/2001 Document Revised: 03/14/2012 Document Reviewed: 12/12/2011 [facility] Patient Information 2015 [employer], [place]. This information is not intended to replace advice given to you by your health care provider. Make sure you discuss any questions you have with your health care provider.

## 2014-01-07 NOTE — ED Notes (Signed)
Pt c/o R hand pain and swelling. States she hit her R hand on a pole about a month ago and re-injured it tonight when she was playing tug o' war. Pt has some bruising and swelling to R hand.

## 2014-01-07 NOTE — ED Provider Notes (Signed)
CSN: [id number]     Arrival date & time 01/07/14  2146 History  This chart was scribed for non-physician practitioner, Ivonne AndrewPeter Shaquayla Klimas, PA-C,working with Linwood DibblesJon Knapp, MD, by Karle PlumberJennifer Tensley, ED Scribe.  This patient was seen in room WTR8/WTR8 and the patient's care was started at 10:29 PM.  Chief Complaint  Patient presents with  . Hand Injury   The history is provided by the patient. No language interpreter was used.   HPI Comments:  Earl ManyJessica C Garrett is a 28 y.o. obese female who presents to the Emergency Department complaining of moderate right hand pain. Pt states she hit the hand on a post a few weeks ago and again today about 8 hours ago. She then hurt her hand again playing while playing tug of war. She denies numbness or tingling of the hand.  History reviewed. No pertinent past medical history. History reviewed. No pertinent past surgical history. No family history on file. History  Substance Use Topics  . Smoking status: Current Every Day Smoker  . Smokeless tobacco: Not on file  . Alcohol Use: No   OB History   Grav Para Term Preterm Abortions TAB SAB Ect Mult Living   1 1 1       1      Review of Systems  Musculoskeletal: Positive for arthralgias.  Skin: Positive for wound (abrasion form rope on right hand).  Neurological: Negative for numbness.  All other systems reviewed and are negative.   Allergies  Amoxil  Home Medications   Prior to Admission medications   Medication Sig Start Date End Date Taking? Authorizing Provider  acetaminophen (TYLENOL) 325 MG tablet Take 650 mg by mouth every 6 (six) hours as needed for pain.   Yes Historical Provider, MD  ibuprofen (ADVIL,MOTRIN) 600 MG tablet Take 1 tablet (600 mg total) by mouth every 6 (six) hours. 04/19/13  Yes Oliver PilaKathy W Richardson, MD  Multiple Vitamin (MULTIVITAMIN) tablet Take 1 tablet by mouth daily.   Yes Historical Provider, MD   Triage Vitals: BP 144/95  Pulse 95  Temp(Src) 99.3 F (37.4 C) (Oral)  Resp 16   SpO2 97% Physical Exam  Nursing note and vitals reviewed. Constitutional: She is oriented to person, place, and time. She appears well-developed and well-nourished.  HENT:  Head: Normocephalic and atraumatic.  Eyes: EOM are normal.  Neck: Normal range of motion.  Cardiovascular: Normal rate.   Pulmonary/Chest: Effort normal.  Musculoskeletal: Normal range of motion.  Moderate swelling around the right hand over the ulnar aspect of the wrist, carpal bones and distal fifth metacarpal. There is erythematous line tangling across the dorsal hand consistent with history of rope. There is no gross deformity. There is slightly reduced range of motion of the fifth digit secondary to pain and swelling. Normal distal sensations to light touch and capillary refill less than 2 seconds.  Neurological: She is alert and oriented to person, place, and time.  Skin: Skin is warm and dry.  Psychiatric: She has a normal mood and affect. Her behavior is normal.    ED Course  Procedures  DIAGNOSTIC STUDIES: Oxygen Saturation is 97% on RA, normal by my interpretation.   COORDINATION OF CARE: 10:34 PM- Will X-Ray right hand. Pt verbalizes understanding and agrees to plan.  X-rays reviewed with patient. No fractures or other concerning injuries. I recommended that the patient use rest, ice, compression elevation to reduce pain and swelling of the hand.  Imaging Review Dg Hand Complete Right  01/07/2014   CLINICAL  DATA:  Hand injury and pain, mostly at fifth metacarpal  EXAM: RIGHT HAND - COMPLETE 3+ VIEW  COMPARISON:  None.  FINDINGS: There is no evidence of fracture or dislocation. There is no evidence of arthropathy or other focal bone abnormality. Mild soft tissue swelling seen along the lateral aspect of the fifth metacarpal.  IMPRESSION: Mild lateral soft tissue swelling. No underlying osseous abnormality.   Electronically Signed   By: Myles RosenthalJohn  Stahl M.D.   On: 01/07/2014 22:53     MDM   Final diagnoses:   Strain of hand and finger, right, initial encounter      I personally performed the services described in this documentation, which was scribed in my presence. The recorded information has been reviewed and is accurate.    Angus Sellereter S Alfhild Partch, PA-C 01/07/14 2318

## 2014-01-08 NOTE — ED Provider Notes (Signed)
Medical screening examination/treatment/procedure(s) were performed by non-physician practitioner and as supervising physician I was immediately available for consultation/collaboration.    Linwood DibblesJon Celie Desrochers, MD 01/08/14 0010

## 2014-05-05 ENCOUNTER — Encounter (HOSPITAL_COMMUNITY): Payer: Self-pay | Admitting: Emergency Medicine

## 2015-05-26 IMAGING — CR DG HAND COMPLETE 3+V*R*
3 series · 3 of 3 positions shown · non-contrast
Comparison: None.

CLINICAL DATA: Hand injury and pain, mostly at fifth metacarpal

EXAM:
RIGHT HAND - COMPLETE 3+ VIEW

[x hand pa right]
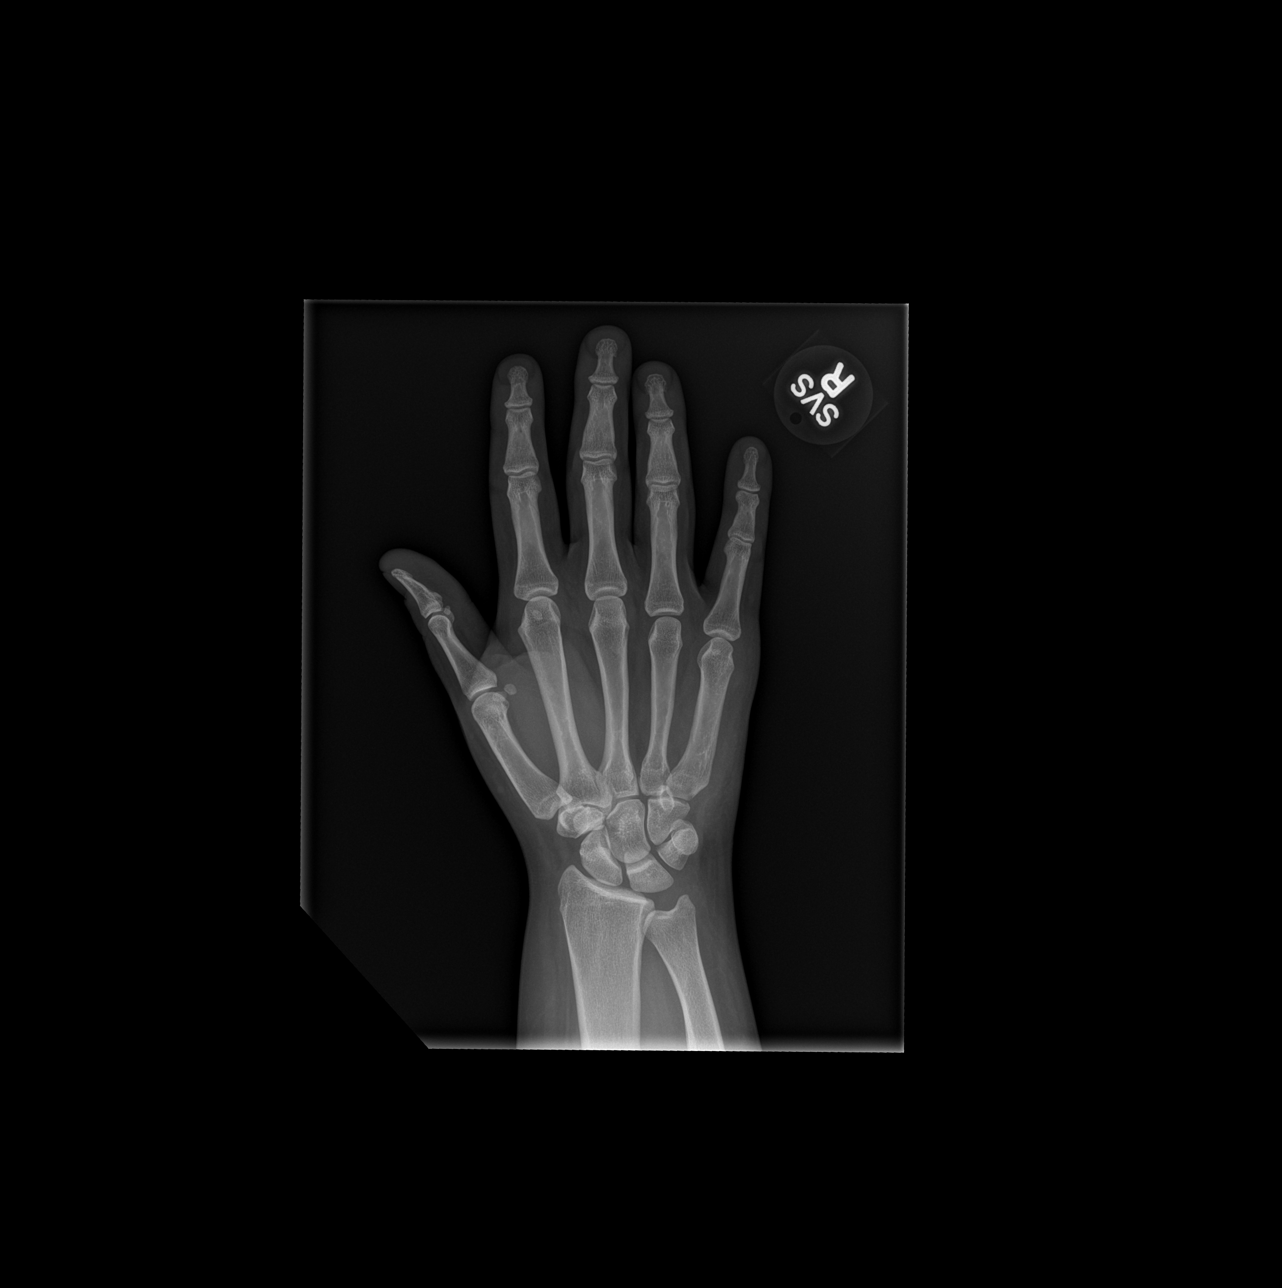

[x hand obl right]
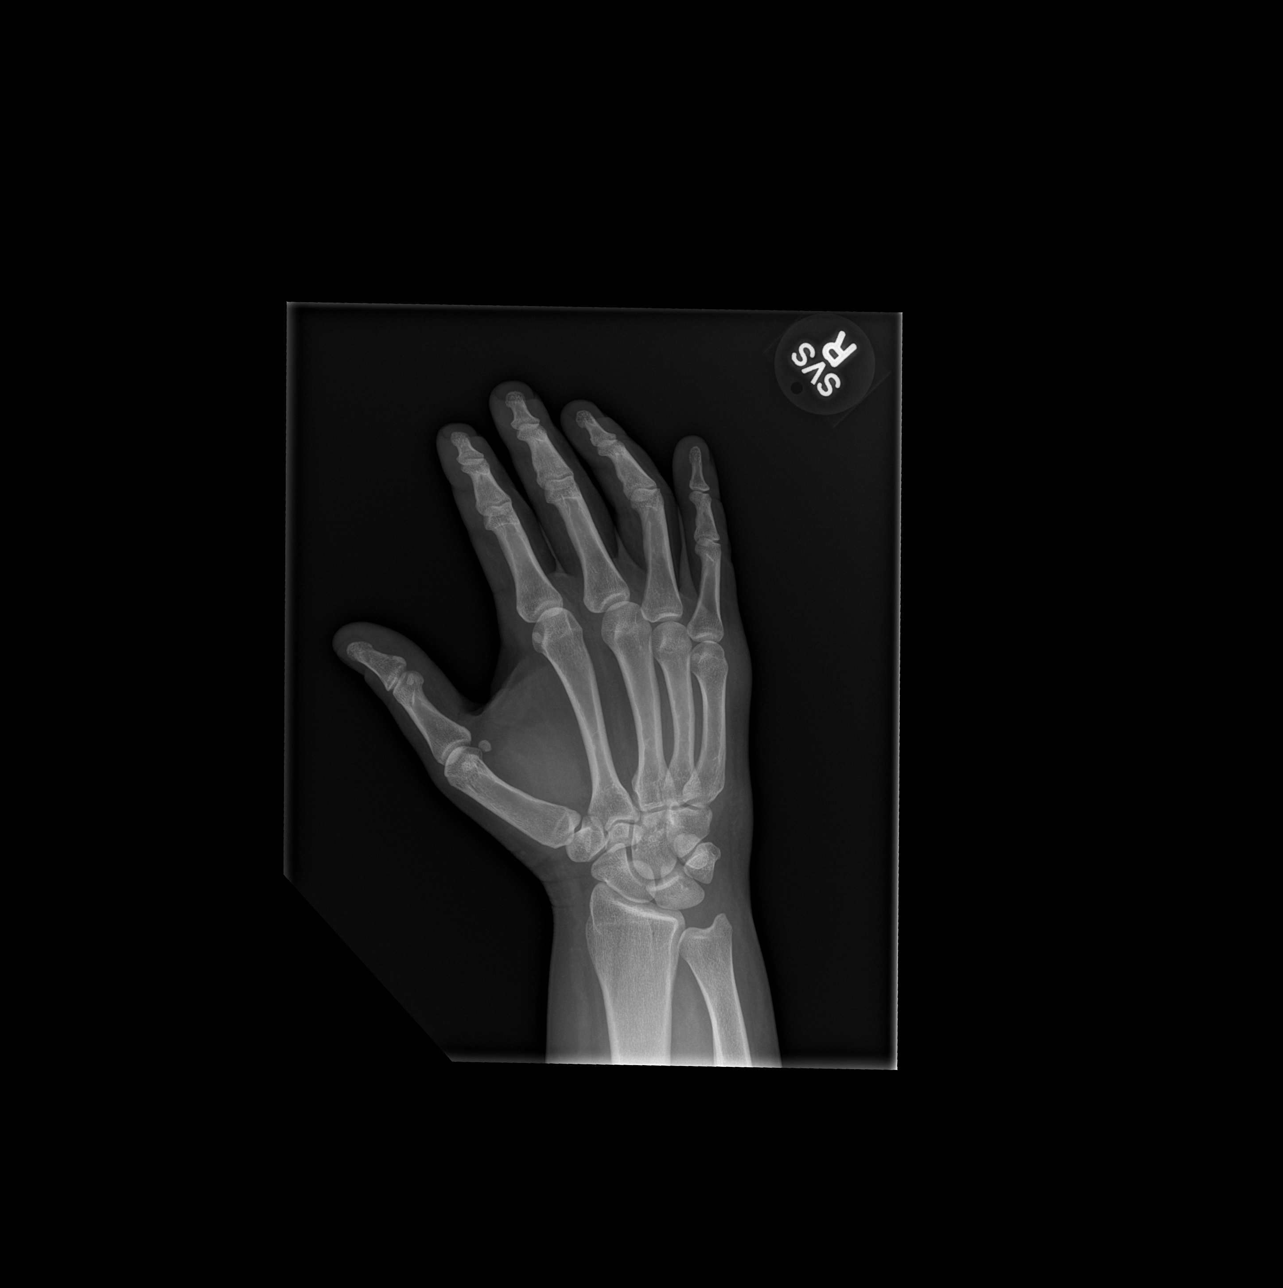

[x hand lat right]
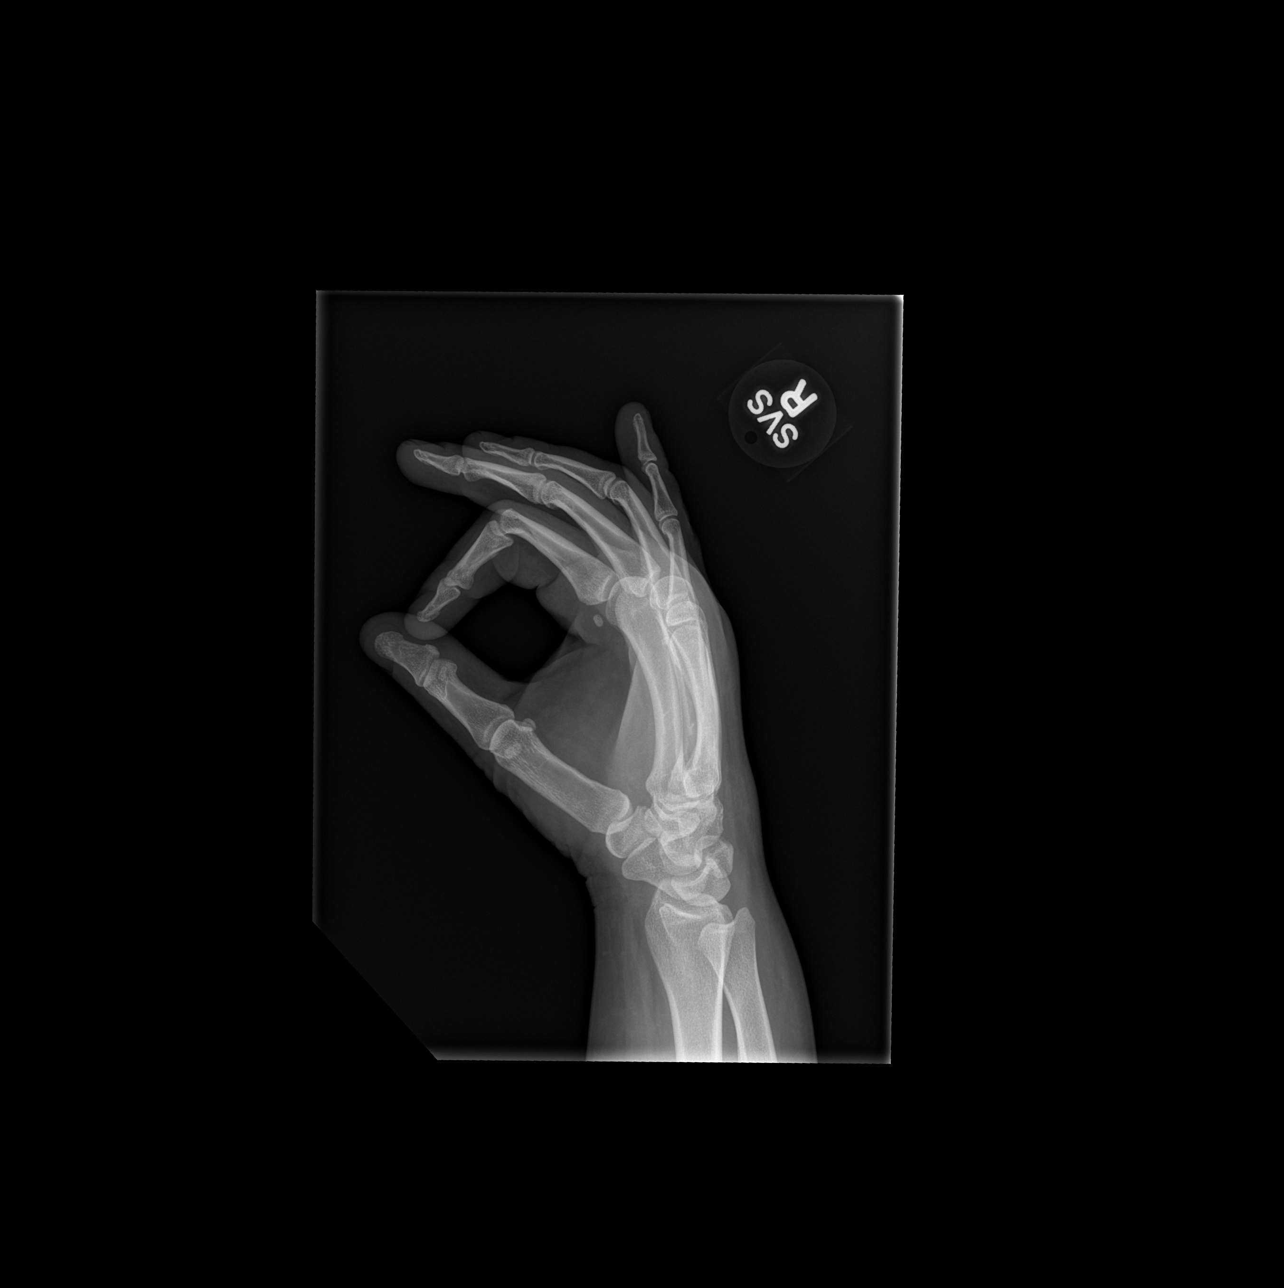

[3 of 3 positions shown; findings below may reference images not displayed]

FINDINGS: There is no evidence of fracture or dislocation. There is no
evidence of arthropathy or other focal bone abnormality. Mild soft
tissue swelling seen along the lateral aspect of the fifth
metacarpal.
IMPRESSION: Mild lateral soft tissue swelling. No underlying osseous
abnormality.

## 2016-07-10 ENCOUNTER — Encounter (HOSPITAL_COMMUNITY): Payer: Self-pay | Admitting: *Deleted

## 2016-07-10 ENCOUNTER — Ambulatory Visit (HOSPITAL_COMMUNITY)
Admission: EM | Admit: 2016-07-10 | Discharge: 2016-07-10 | Disposition: A | Discharge status: Home / Self Care | Payer: Medicaid Other | Attending: Family Medicine | Admitting: Family Medicine

## 2016-07-10 DIAGNOSIS — S83002A Unspecified subluxation of left patella, initial encounter: Secondary | ICD-10-CM

## 2016-07-10 MED ORDER — IBUPROFEN 800 MG PO TABS: 800.0000 mg | ORAL_TABLET | Freq: Three times a day (TID) | ORAL | 0 refills | Status: DC

## 2016-07-10 NOTE — ED Triage Notes (Signed)
Reports bending down onto left knee yesterday; when she went to stand back up felt a "pop" in left knee.  Today pain has been "excruciating".  Ambulates with limp.  Has taken Tyl.

## 2016-07-10 NOTE — Discharge Instructions (Signed)
Ice, advil or prescription,

## 2016-07-10 NOTE — ED Provider Notes (Signed)
[facility]    CSN: [id number] Arrival date & time: 07/10/16  1948     History   Chief Complaint Chief Complaint  Patient presents with  . Knee Pain    HPI Meghan Garrett is a 31 y.o. female.   The history is provided by the patient.  Knee Pain  Location:  Knee Time since incident:  1 day Injury: no (stood up and left knee popped with soreness since.)   Knee location:  L knee Pain details:    Quality:  Shooting   Severity:  Moderate   Onset quality:  Sudden Chronicity:  New Dislocation: no   Prior injury to area:  No Relieved by:  Immobilization Worsened by:  Flexion Associated symptoms: decreased ROM   Associated symptoms: no back pain, no numbness, no stiffness and no swelling   Risk factors: obesity     History reviewed. No pertinent past medical history.  Patient Active Problem List   Diagnosis Date Noted  . NSVD (normal spontaneous vaginal delivery) 04/18/2013  . Normal pregnancy, first 04/17/2013    Past Surgical History:  Procedure Laterality Date  . TYMPANOPLASTY WITH GRAFT      OB History    Gravida Para Term Preterm AB Living   1 1 1     1    SAB TAB Ectopic Multiple Live Births           1       Home Medications    Prior to Admission medications   Medication Sig Start Date End Date Taking? Authorizing Provider  acetaminophen (TYLENOL) 325 MG tablet Take 650 mg by mouth every 6 (six) hours as needed for pain.   Yes Historical Provider, MD  HYDROcodone-acetaminophen (NORCO/VICODIN) 5-325 MG per tablet Take 1 tablet by mouth every 4 (four) hours as needed for moderate pain. 01/07/14   Ivonne Andrew, PA-C  ibuprofen (ADVIL,MOTRIN) 800 MG tablet Take 1 tablet (800 mg total) by mouth 3 (three) times daily. 07/10/16   Linna Hoff, MD  Multiple Vitamin (MULTIVITAMIN) tablet Take 1 tablet by mouth daily.    Historical Provider, MD    Family History No family history on file.  Social History Social History  Substance Use Topics  .  Smoking status: Current Every Day Smoker  . Smokeless tobacco: Not on file  . Alcohol use No     Allergies   Percocet [oxycodone-acetaminophen] and Amoxil [amoxicillin]   Review of Systems Review of Systems  Constitutional: Negative.   Musculoskeletal: Positive for gait problem. Negative for back pain, joint swelling and stiffness.  All other systems reviewed and are negative.    Physical Exam Triage Vital Signs ED Triage Vitals  Enc Vitals Group     BP 07/10/16 2004 140/87     Pulse Rate 07/10/16 2004 87     Resp 07/10/16 2004 16     Temp 07/10/16 2004 98.1 F (36.7 C)     Temp Source 07/10/16 2004 Oral     SpO2 07/10/16 2004 98 %     Weight --      Height --      Head Circumference --      Peak Flow --      Pain Score 07/10/16 2008 8     Pain Loc --      Pain Edu? --      Excl. in GC? --    No data found.   Updated Vital Signs BP 140/87   Pulse 87  Temp 98.1 F (36.7 C) (Oral)   Resp 16   SpO2 98%   Breastfeeding? No   Visual Acuity Right Eye Distance:   Left Eye Distance:   Bilateral Distance:    Right Eye Near:   Left Eye Near:    Bilateral Near:     Physical Exam  Constitutional: She is oriented to person, place, and time. She appears well-developed and well-nourished. No distress.  Musculoskeletal: She exhibits tenderness.       Left knee: She exhibits abnormal patellar mobility. She exhibits no swelling and no effusion. Tenderness found. No patellar tendon tenderness noted.       Legs: Neurological: She is alert and oriented to person, place, and time.  Skin: Skin is warm and dry.  Nursing note and vitals reviewed.    UC Treatments / Results  Labs (all labs ordered are listed, but only abnormal results are displayed) Labs Reviewed - No data to display  EKG  EKG Interpretation None       Radiology No results found.  Procedures Procedures (including critical care time)  Medications Ordered in UC Medications - No data to  display   Initial Impression / Assessment and Plan / UC Course  I have reviewed the triage vital signs and the nursing notes.  Pertinent labs & imaging results that were available during my care of the patient were reviewed by me and considered in my medical decision making (see chart for details).       Final Clinical Impressions(s) / UC Diagnoses   Final diagnoses:  Patellar subluxation, left, initial encounter    New Prescriptions Discharge Medication List as of 07/10/2016  8:25 PM       Linna HoffJames D Kindl, MD 07/31/16 1248

## 2016-10-02 ENCOUNTER — Ambulatory Visit (HOSPITAL_COMMUNITY)
Admission: EM | Admit: 2016-10-02 | Discharge: 2016-10-02 | Disposition: A | Discharge status: Home / Self Care | Payer: Medicaid Other | Attending: Family Medicine | Admitting: Family Medicine

## 2016-10-02 ENCOUNTER — Encounter (HOSPITAL_COMMUNITY): Payer: Self-pay | Admitting: Emergency Medicine

## 2016-10-02 DIAGNOSIS — H65194 Other acute nonsuppurative otitis media, recurrent, right ear: Secondary | ICD-10-CM

## 2016-10-02 DIAGNOSIS — J0100 Acute maxillary sinusitis, unspecified: Secondary | ICD-10-CM

## 2016-10-02 DIAGNOSIS — J029 Acute pharyngitis, unspecified: Secondary | ICD-10-CM

## 2016-10-02 MED ORDER — AMOXICILLIN-POT CLAVULANATE 875-125 MG PO TABS: 1.0000 | ORAL_TABLET | Freq: Two times a day (BID) | ORAL | 0 refills | Status: DC

## 2016-10-02 MED ORDER — FLUTICASONE PROPIONATE 50 MCG/ACT NA SUSP: 1.0000 | Freq: Every day | NASAL | 2 refills | Status: DC

## 2016-10-02 MED ORDER — AMOXICILLIN-POT CLAVULANATE 875-125 MG PO TABS: 1.0000 | ORAL_TABLET | Freq: Two times a day (BID) | ORAL | 0 refills | Status: AC

## 2016-10-02 MED ORDER — FLUCONAZOLE 150 MG PO TABS: 150.0000 mg | ORAL_TABLET | Freq: Once | ORAL | 0 refills | Status: AC

## 2016-10-02 MED ORDER — FLUTICASONE PROPIONATE 50 MCG/ACT NA SUSP: 1.0000 | Freq: Every day | NASAL | 2 refills | Status: AC

## 2016-10-02 NOTE — ED Triage Notes (Signed)
The patient presented to the [facility] with a complaint of a cough, sinus pain and pressure and bilateral otalgia.

## 2016-10-02 NOTE — ED Provider Notes (Signed)
[facility]    CSN: [id number] Arrival date & time: 10/02/16  1829     History   Chief Complaint Chief Complaint  Patient presents with  . Cough    HPI Meghan Garrett is a 31 y.o. female.   The patient presented to the [facility] with a complaint of a cough, sinus pain and pressure and bilateral otalgia. The symptoms began about 5 days ago with sinus congestion but over the last 48 hours patient has developed increasing right ear pain, loss of hearing, and sore throat.  Patient has a lifelong history of recurrent ear infections.      History reviewed. No pertinent past medical history.  Patient Active Problem List   Diagnosis Date Noted  . NSVD (normal spontaneous vaginal delivery) 04/18/2013  . Normal pregnancy, first 04/17/2013    Past Surgical History:  Procedure Laterality Date  . TYMPANOPLASTY WITH GRAFT      OB History    Gravida Para Term Preterm AB Living   SAB TAB Ectopic Multiple Live Births           1       Home Medications    Prior to Admission medications   Medication Sig Start Date End Date Taking? Authorizing Provider  varenicline (CHANTIX PAK) 0.5 MG X 11 & 1 MG X 42 tablet Take by mouth 2 (two) times daily. Take one 0.5 mg tablet by mouth once daily for 3 days, then increase to one 0.5 mg tablet twice daily for 4 days, then increase to one 1 mg tablet twice daily.   Yes Historical Provider, MD  amoxicillin-clavulanate (AUGMENTIN) 875-125 MG tablet Take 1 tablet by mouth every 12 (twelve) hours. 10/02/16   Elvina Sidle, MD  fluconazole (DIFLUCAN) 150 MG tablet Take 1 tablet (150 mg total) by mouth once. Repeat if needed 10/02/16 10/02/16  Elvina Sidle, MD  fluticasone [facility]) 50 MCG/ACT nasal spray Place 1 spray into both nostrils daily. 10/02/16   Elvina Sidle, MD    Family History History reviewed. No pertinent family history.  Social History Social History  Substance Use Topics  . Smoking status: Former Games developer  .  Smokeless tobacco: Never Used  . Alcohol use No     Allergies   Percocet [oxycodone-acetaminophen] and Amoxil [amoxicillin]   Review of Systems Review of Systems  Constitutional: Negative.   HENT: Positive for congestion, ear pain, hearing loss, sinus pain, sinus pressure and sore throat.   Respiratory: Positive for cough.   Gastrointestinal: Negative.   Musculoskeletal: Negative.   Neurological: Negative.      Physical Exam Triage Vital Signs ED Triage Vitals  Enc Vitals Group     BP 10/02/16 1905 (!) 157/88     Pulse Rate 10/02/16 1905 92     Resp 10/02/16 1905 20     Temp 10/02/16 1905 98.6 F (37 C)     Temp Source 10/02/16 1905 Oral     SpO2 10/02/16 1905 95 %     Weight --      Height --      Head Circumference --      Peak Flow --      Pain Score 10/02/16 1904 5     Pain Loc --      Pain Edu? --      Excl. in GC? --    No data found.   Updated Vital Signs BP (!) 157/88 (BP Location: Right  Wrist)   Pulse 92   Temp 98.6 F (37 C) (Oral)   Resp 20   SpO2 95%    Physical Exam  Constitutional: She is oriented to person, place, and time. She appears well-developed and well-nourished.  HENT:  Right Ear: External ear normal.  Left Ear: External ear normal.  Mouth/Throat: Oropharynx is clear and moist.  Both ears are retracted, the right ear is red and and landmarks are  Eyes: Conjunctivae are normal. Pupils are equal, round, and reactive to light.  Neck: Normal range of motion. Neck supple.  Cardiovascular: Normal rate, regular rhythm and normal heart sounds.   Pulmonary/Chest: Effort normal and breath sounds normal.  Musculoskeletal: Normal range of motion.  Neurological: She is alert and oriented to person, place, and time.  Skin: Skin is warm and dry.  Nursing note and vitals reviewed.    UC Treatments / Results  Labs (all labs ordered are listed, but only abnormal results are displayed) Labs Reviewed - No data to display  EKG  EKG  Interpretation None       Radiology No results found.  Procedures Procedures (including critical care time)  Medications Ordered in UC Medications - No data to display   Initial Impression / Assessment and Plan / UC Course  I have reviewed the triage vital signs and the nursing notes.  Pertinent labs & imaging results that were available during my care of the patient were reviewed by me and considered in my medical decision making (see chart for details).     Final Clinical Impressions(s) / UC Diagnoses   Final diagnoses:  Other recurrent acute nonsuppurative otitis media of right ear  Acute maxillary sinusitis, recurrence not specified  Sore throat    New Prescriptions New Prescriptions   AMOXICILLIN-CLAVULANATE (AUGMENTIN) 875-125 MG TABLET    Take 1 tablet by mouth every 12 (twelve) hours.   FLUCONAZOLE (DIFLUCAN) 150 MG TABLET    Take 1 tablet (150 mg total) by mouth once. Repeat if needed   FLUTICASONE (FLONASE) 50 MCG/ACT NASAL SPRAY    Place 1 spray into both nostrils daily.     Elvina Sidle, MD 10/02/16 1924

## 2017-06-27 ENCOUNTER — Encounter (HOSPITAL_COMMUNITY): Payer: Self-pay | Admitting: Family Medicine

## 2017-06-27 ENCOUNTER — Emergency Department (HOSPITAL_COMMUNITY)
Admission: EM | Admit: 2017-06-27 | Discharge: 2017-06-28 | Disposition: A | Discharge status: Home / Self Care | Payer: Medicaid Other | Attending: Emergency Medicine | Admitting: Emergency Medicine

## 2017-06-27 DIAGNOSIS — J029 Acute pharyngitis, unspecified: Secondary | ICD-10-CM | POA: Insufficient documentation

## 2017-06-27 DIAGNOSIS — R070 Pain in throat: Secondary | ICD-10-CM | POA: Diagnosis present

## 2017-06-27 DIAGNOSIS — F17210 Nicotine dependence, cigarettes, uncomplicated: Secondary | ICD-10-CM | POA: Insufficient documentation

## 2017-06-27 DIAGNOSIS — R05 Cough: Secondary | ICD-10-CM | POA: Diagnosis not present

## 2017-06-27 DIAGNOSIS — Z79899 Other long term (current) drug therapy: Secondary | ICD-10-CM | POA: Insufficient documentation

## 2017-06-27 LAB — RAPID STREP SCREEN (MED CTR MEBANE ONLY): Streptococcus, Group A Screen (Direct): NEGATIVE

## 2017-06-27 NOTE — ED Triage Notes (Signed)
Patient is complaining of a sore throat that started yesterday. Patient reports her daughter was diagnosed with strep throat on Thursday. Denies fever.

## 2017-06-28 MED ORDER — PREDNISONE 10 MG PO TABS: 40.0000 mg | ORAL_TABLET | Freq: Every day | ORAL | 0 refills | Status: AC

## 2017-06-28 MED ORDER — LIDOCAINE VISCOUS 2 % MT SOLN: 15.0000 mL | OROMUCOSAL | 0 refills | Status: AC | PRN

## 2017-06-28 NOTE — ED Provider Notes (Signed)
[facility]-EMERGENCY DEPT Provider Note   CSN: [id number] Arrival date & time: 06/27/17  2253     History   Chief Complaint Chief Complaint  Patient presents with  . Sore Throat    HPI Meghan Garrett is a 31 y.o. female who presents to ED for evaluation of 2-day history of sore throat and intermittent cough.  She reports pain worse with eating and drinking.  States that her young daughter was diagnosed with strep recently.  She denies any fevers, trouble breathing, trouble swallowing, drooling, neck pain, URI symptoms.  HPI  History reviewed. No pertinent past medical history.  Patient Active Problem List   Diagnosis Date Noted  . NSVD (normal spontaneous vaginal delivery) 04/18/2013  . Normal pregnancy, first 04/17/2013    Past Surgical History:  Procedure Laterality Date  . TYMPANOPLASTY WITH GRAFT      OB History    Gravida Para Term Preterm AB Living   1 1 1     1    SAB TAB Ectopic Multiple Live Births           1       Home Medications    Prior to Admission medications   Medication Sig Start Date End Date Taking? Authorizing Provider  amoxicillin-clavulanate (AUGMENTIN) 875-125 MG tablet Take 1 tablet by mouth every 12 (twelve) hours. 10/02/16   Elvina SidleLauenstein, Kurt, MD  fluticasone (FLONASE) 50 MCG/ACT nasal spray Place 1 spray into both nostrils daily. 10/02/16   Elvina SidleLauenstein, Kurt, MD  lidocaine (XYLOCAINE) 2 % solution Use as directed 15 mLs in the mouth or throat as needed for mouth pain. 06/28/17   Eyana Stolze, PA-C  predniSONE (DELTASONE) 10 MG tablet Take 4 tablets (40 mg total) by mouth daily for 4 days. 06/28/17 07/02/17  Mazzy Santarelli, PA-C  varenicline (CHANTIX PAK) 0.5 MG X 11 & 1 MG X 42 tablet Take by mouth 2 (two) times daily. Take one 0.5 mg tablet by mouth once daily for 3 days, then increase to one 0.5 mg tablet twice daily for 4 days, then increase to one 1 mg tablet twice daily.    [provider]    Family  History History reviewed. No pertinent family history.  Social History Social History   Tobacco Use  . Smoking status: Current Every Day Smoker    Packs/day: 0.25  . Smokeless tobacco: Never Used  Substance Use Topics  . Alcohol use: No  . Drug use: No     Allergies   Percocet [oxycodone-acetaminophen] and Amoxil [amoxicillin]   Review of Systems Review of Systems  Constitutional: Negative for appetite change, chills and fever.  HENT: Positive for sore throat. Negative for congestion, dental problem, drooling, facial swelling, mouth sores, rhinorrhea, sinus pain, tinnitus, trouble swallowing and voice change.   Eyes: Negative for visual disturbance.  Respiratory: Negative for cough and shortness of breath.   Cardiovascular: Negative for chest pain.  Gastrointestinal: Negative for nausea and vomiting.  Neurological: Negative for headaches.     Physical Exam Updated Vital Signs BP (!) 146/121 (BP Location: Right Arm)   Pulse 99   Temp 98.4 F (36.9 C) (Oral)   Resp 20   Ht 5\' 8"  (1.727 m)   Wt (!) 158.8 kg (350 lb)   SpO2 99%   BMI 53.22 kg/m   Physical Exam  Constitutional: She appears well-developed and well-nourished. No distress.  Nontoxic appearing and in no acute distress.  HENT:  Head: Normocephalic and atraumatic.  Right Ear:  Tympanic membrane normal.  Left Ear: Tympanic membrane normal.  Nose: Nose normal.  Mouth/Throat: Uvula is midline. Posterior oropharyngeal edema and posterior oropharyngeal erythema present. Tonsils are 2+ on the right. Tonsils are 2+ on the left.  Patient does not appear to be in acute distress. No trismus or drooling present. No pooling of secretions. Patient is tolerating secretions and is not in respiratory distress. No neck pain or tenderness to palpation of the neck. Full active and passive range of motion of the neck. No evidence of RPA or PTA.  Eyes: Conjunctivae and EOM are normal. No scleral icterus.  Neck: Normal range of  motion.  Cardiovascular: Normal rate, regular rhythm and normal heart sounds.  Pulmonary/Chest: Effort normal and breath sounds normal. No respiratory distress.  Neurological: She is alert.  Skin: No rash noted. She is not diaphoretic.  Psychiatric: She has a normal mood and affect.  Nursing note and vitals reviewed.    ED Treatments / Results  Labs (all labs ordered are listed, but only abnormal results are displayed) Labs Reviewed  RAPID STREP SCREEN (NOT AT [facility])  CULTURE, GROUP A STREP [facility])    EKG  EKG Interpretation None       Radiology No results found.  Procedures Procedures (including critical care time)  Medications Ordered in ED Medications - No data to display   Initial Impression / Assessment and Plan / ED Course  I have reviewed the triage vital signs and the nursing notes.  Pertinent labs & imaging results that were available during my care of the patient were reviewed by me and considered in my medical decision making (see chart for details).  Clinical Course as of Jun 28 17  Tue Jun 27, 2017  2356 Different patient in room when I went in to assess.  [HK]    Clinical Course User Index [HK] Jenna Routzahn, PA-C    Patient, with no significant past medical history, who presents to ED for evaluation of 2-day history of sore throat and intermittent cough.  Daughter at home was diagnosed with strep recently.  She denies any other URI or drooling.  She is afebrile here.  She is tolerating secretions with no signs of airway compromise.  She has full active and passive range of motion of neck.  There is posterior oropharyngeal edema and erythema and enlarged tonsils that are symmetrical.  There is no deviation of uvula or sign of RPA or PTA.  Her strep test was negative.  She is hypertensive here however, she denies any chest pain, shortness of breath or headache.  She states that she would prefer to follow-up with a primary care provider about her blood  pressure and does not feel that she needs to be admitted for it at this time.  Will send for throat culture.  Will give symptomatic treatment with steroids and lidocaine to swish and spit to help with throat discomfort.  Patient appears stable for discharge at this time.  Strict return precautions given.  Final Clinical Impressions(s) / ED Diagnoses   Final diagnoses:  Viral pharyngitis    ED Discharge Orders        Ordered    predniSONE (DELTASONE) 10 MG tablet  Daily     06/28/17 0013    lidocaine (XYLOCAINE) 2 % solution  As needed     06/28/17 0013     Portions of this note were generated with Dragon dictation software. Dictation errors may occur despite best attempts at proofreading.  Dietrich PatesKhatri, Raynald Rouillard, PA-C 06/28/17 0019    Dione BoozeGlick, David, MD 06/28/17 [phone]

## 2017-06-28 NOTE — Discharge Instructions (Signed)
Please read attached information regarding your condition. Take steroids as directed. Swish and spit lidocaine solution as needed for throat discomfort.  Do not swallow. Follow-up with your primary care provider for further evaluation. Return to ED for worsening symptoms, trouble breathing, trouble swallowing, chest pain.

## 2017-06-30 LAB — CULTURE, GROUP A STREP (THRC)

## 2017-11-29 ENCOUNTER — Encounter

## 2017-11-29 ENCOUNTER — Ambulatory Visit (HOSPITAL_COMMUNITY): Payer: Self-pay | Admitting: Licensed Clinical Social Worker

## 2017-12-08 ENCOUNTER — Ambulatory Visit (HOSPITAL_COMMUNITY): Payer: Medicaid Other | Admitting: Psychiatry

## 2017-12-08 ENCOUNTER — Encounter

## 2019-08-05 ENCOUNTER — Ambulatory Visit: Payer: Medicaid Other | Attending: Internal Medicine

## 2019-08-05 DIAGNOSIS — Z20822 Contact with and (suspected) exposure to covid-19: Secondary | ICD-10-CM

## 2019-08-06 LAB — NOVEL CORONAVIRUS, NAA: SARS-CoV-2, NAA: NOT DETECTED

## 2020-05-23 ENCOUNTER — Other Ambulatory Visit: Payer: Medicaid Other
